# Patient Record
Sex: Male | Born: 1955 | Race: Black or African American | Hispanic: No | Marital: Married | Smoking: Former smoker
Health system: Southern US, Community
[De-identification: ages and names within clinical notes are randomized; demographics above are authoritative.]

## PROBLEM LIST (undated history)

## (undated) DIAGNOSIS — J939 Pneumothorax, unspecified: Secondary | ICD-10-CM

## (undated) DIAGNOSIS — I10 Essential (primary) hypertension: Secondary | ICD-10-CM

## (undated) DIAGNOSIS — R569 Unspecified convulsions: Secondary | ICD-10-CM

## (undated) HISTORY — PX: INNER EAR SURGERY: SHX679

## (undated) HISTORY — PX: CHEST TUBE INSERTION: SHX231

---

## 1999-07-10 ENCOUNTER — Emergency Department (HOSPITAL_COMMUNITY): Admission: EM | Admit: 1999-07-10 | Discharge: 1999-07-10 | Payer: Self-pay | Admitting: Emergency Medicine

## 2000-07-14 ENCOUNTER — Encounter: Payer: Self-pay | Admitting: Emergency Medicine

## 2000-07-14 ENCOUNTER — Emergency Department (HOSPITAL_COMMUNITY): Admission: EM | Admit: 2000-07-14 | Discharge: 2000-07-14 | Payer: Self-pay | Admitting: Emergency Medicine

## 2000-07-21 ENCOUNTER — Encounter: Payer: Self-pay | Admitting: *Deleted

## 2000-07-21 ENCOUNTER — Encounter: Admission: RE | Admit: 2000-07-21 | Discharge: 2000-07-21 | Payer: Self-pay | Admitting: *Deleted

## 2001-05-10 ENCOUNTER — Emergency Department (HOSPITAL_COMMUNITY): Admission: EM | Admit: 2001-05-10 | Discharge: 2001-05-10 | Payer: Self-pay | Admitting: Emergency Medicine

## 2003-02-07 ENCOUNTER — Ambulatory Visit (HOSPITAL_COMMUNITY): Admission: RE | Admit: 2003-02-07 | Discharge: 2003-02-07 | Payer: Self-pay | Admitting: *Deleted

## 2003-02-07 ENCOUNTER — Encounter: Payer: Self-pay | Admitting: *Deleted

## 2003-02-23 ENCOUNTER — Encounter: Admission: RE | Admit: 2003-02-23 | Discharge: 2003-02-23 | Payer: Self-pay | Admitting: Neurology

## 2003-02-23 ENCOUNTER — Encounter: Payer: Self-pay | Admitting: Neurology

## 2004-03-06 ENCOUNTER — Inpatient Hospital Stay (HOSPITAL_COMMUNITY): Admission: EM | Admit: 2004-03-06 | Discharge: 2004-03-12 | Payer: Self-pay | Admitting: Emergency Medicine

## 2004-03-18 ENCOUNTER — Encounter
Admission: RE | Admit: 2004-03-18 | Discharge: 2004-03-18 | Payer: Self-pay | Admitting: Thoracic Surgery (Cardiothoracic Vascular Surgery)

## 2004-04-05 ENCOUNTER — Encounter
Admission: RE | Admit: 2004-04-05 | Discharge: 2004-04-05 | Payer: Self-pay | Admitting: Thoracic Surgery (Cardiothoracic Vascular Surgery)

## 2006-03-16 ENCOUNTER — Encounter: Admission: RE | Admit: 2006-03-16 | Discharge: 2006-03-16 | Payer: Self-pay | Admitting: Family Medicine

## 2006-06-02 ENCOUNTER — Encounter: Admission: RE | Admit: 2006-06-02 | Discharge: 2006-06-02 | Payer: Self-pay | Admitting: Neurology

## 2006-08-19 IMAGING — CR DG CHEST 2V
2 series · 2 of 2 positions shown · non-contrast
Comparison: 04/05/04.

CLINICAL DATA: Left sided chest pain.
 CHEST X-RAY:

[w chest pa]
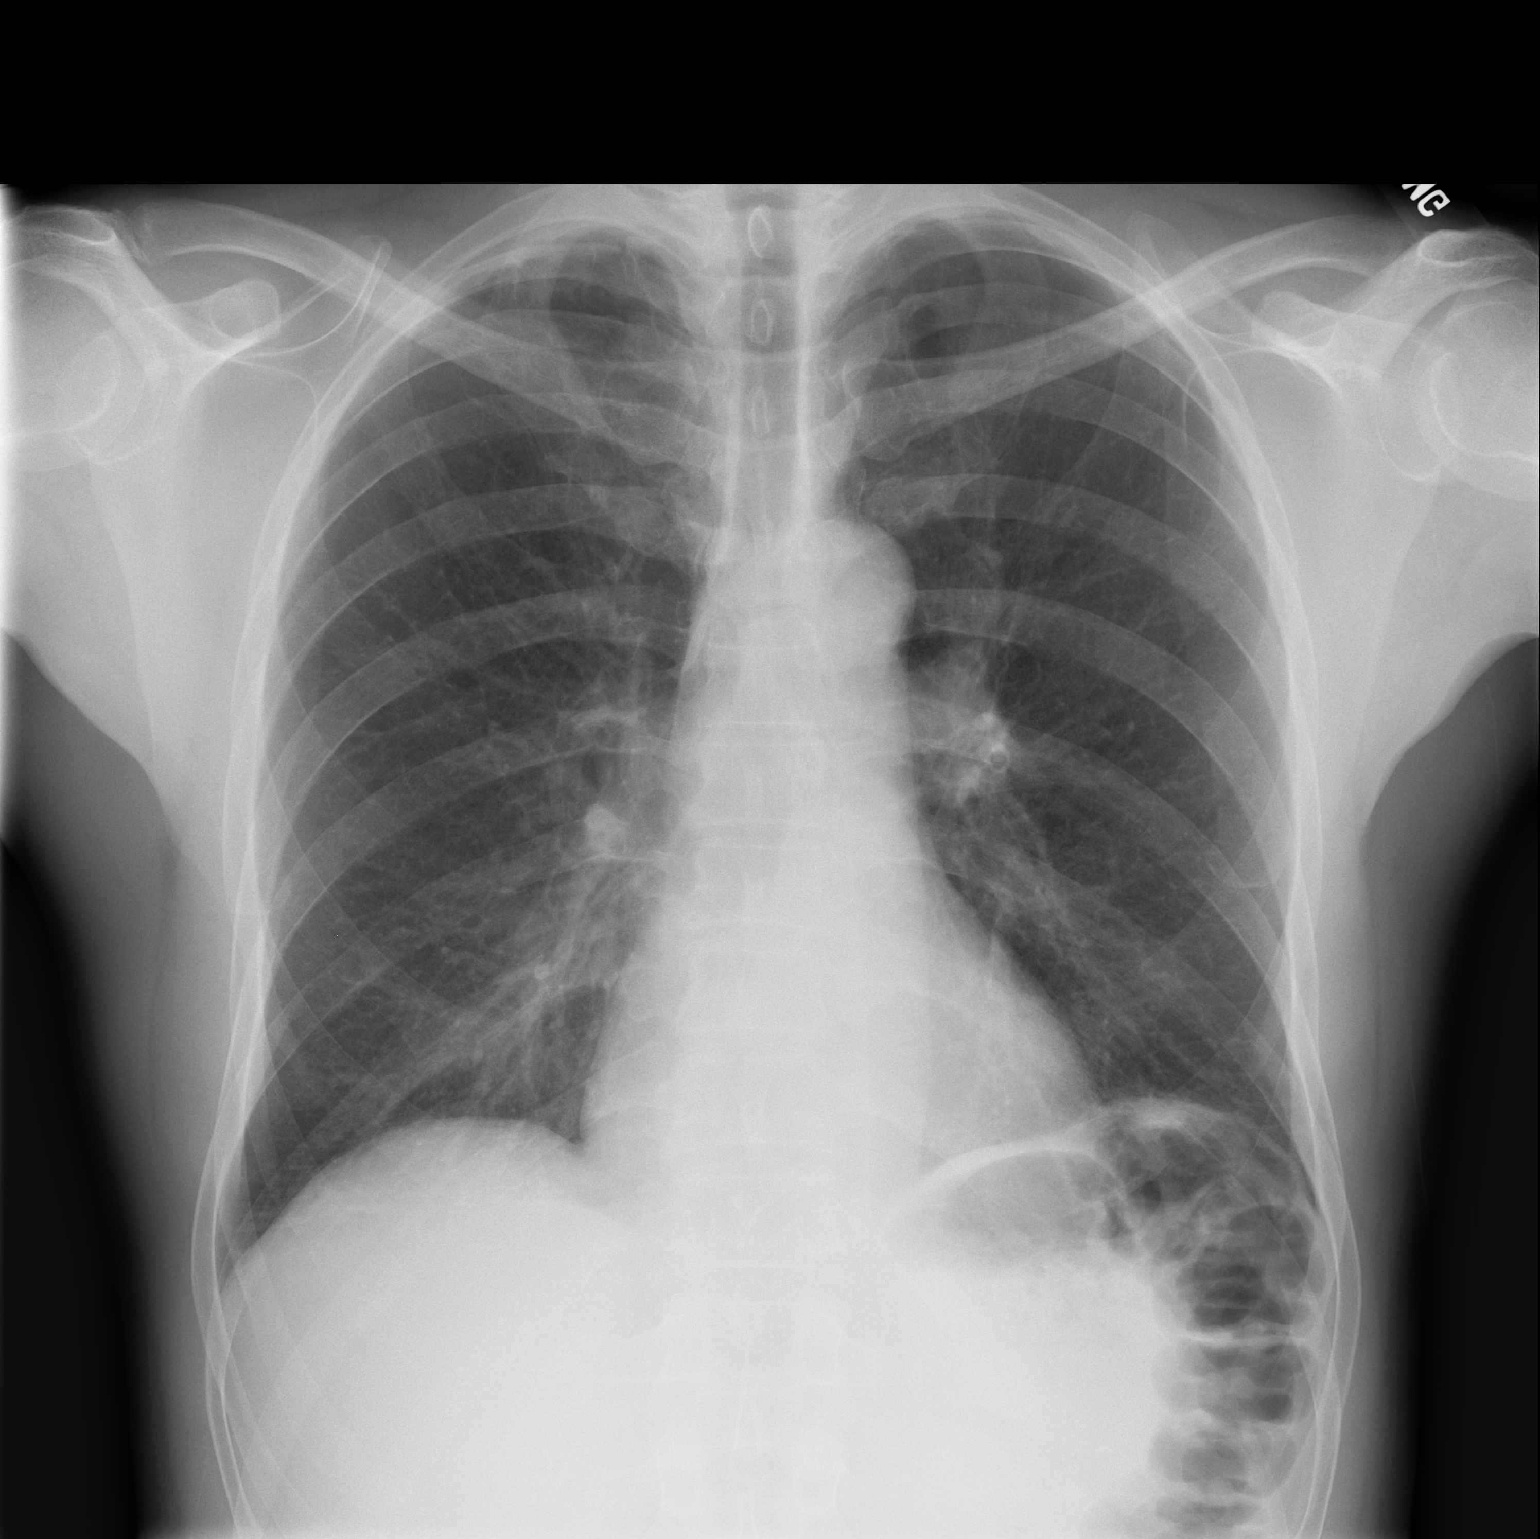

[w chest lat]
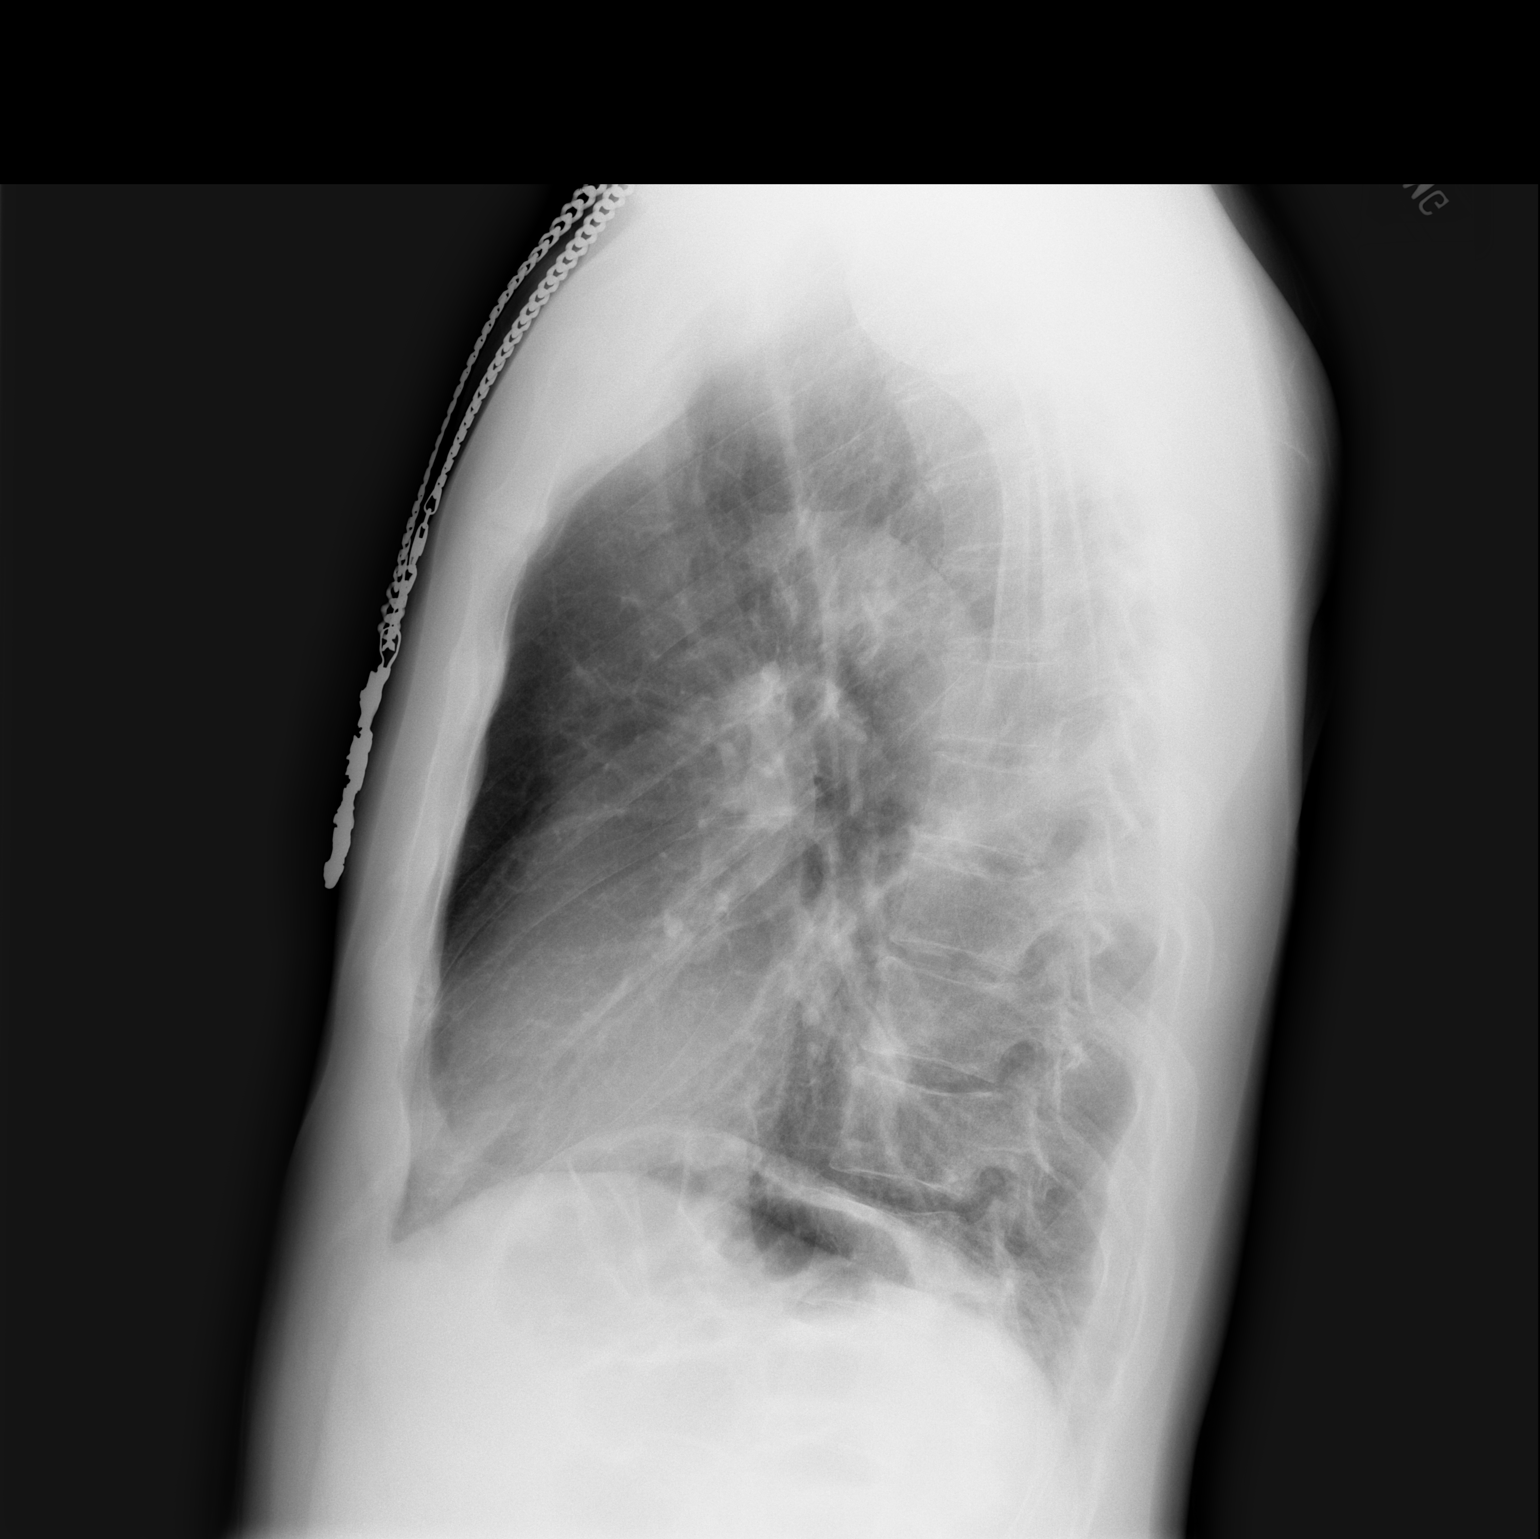

[2 of 2 positions shown; findings below may reference images not displayed]

Two views of the chest show no active infiltrate or effusion.  Minimal linear atelectasis is present at the right lung base.  The heart shows mild peribronchial thickening present. The heart is within normal limits in size.
IMPRESSION: Mild right basilar atelectasis.  Mild peribronchial thickening.

## 2006-11-05 IMAGING — CR DG SHOULDER 2+V*L*
3 series · 3 of 3 positions shown · non-contrast
Comparison: Report of [REDACTED] left shoulder radiograph, 07/21/00, (films purged).

CLINICAL DATA: Status post fall injury one week ago.  Decreased range of  motion.   Left shoulder pain.
 LEFT SHOULDER, THREE VIEWS:

[view not recorded (1 of 3)]
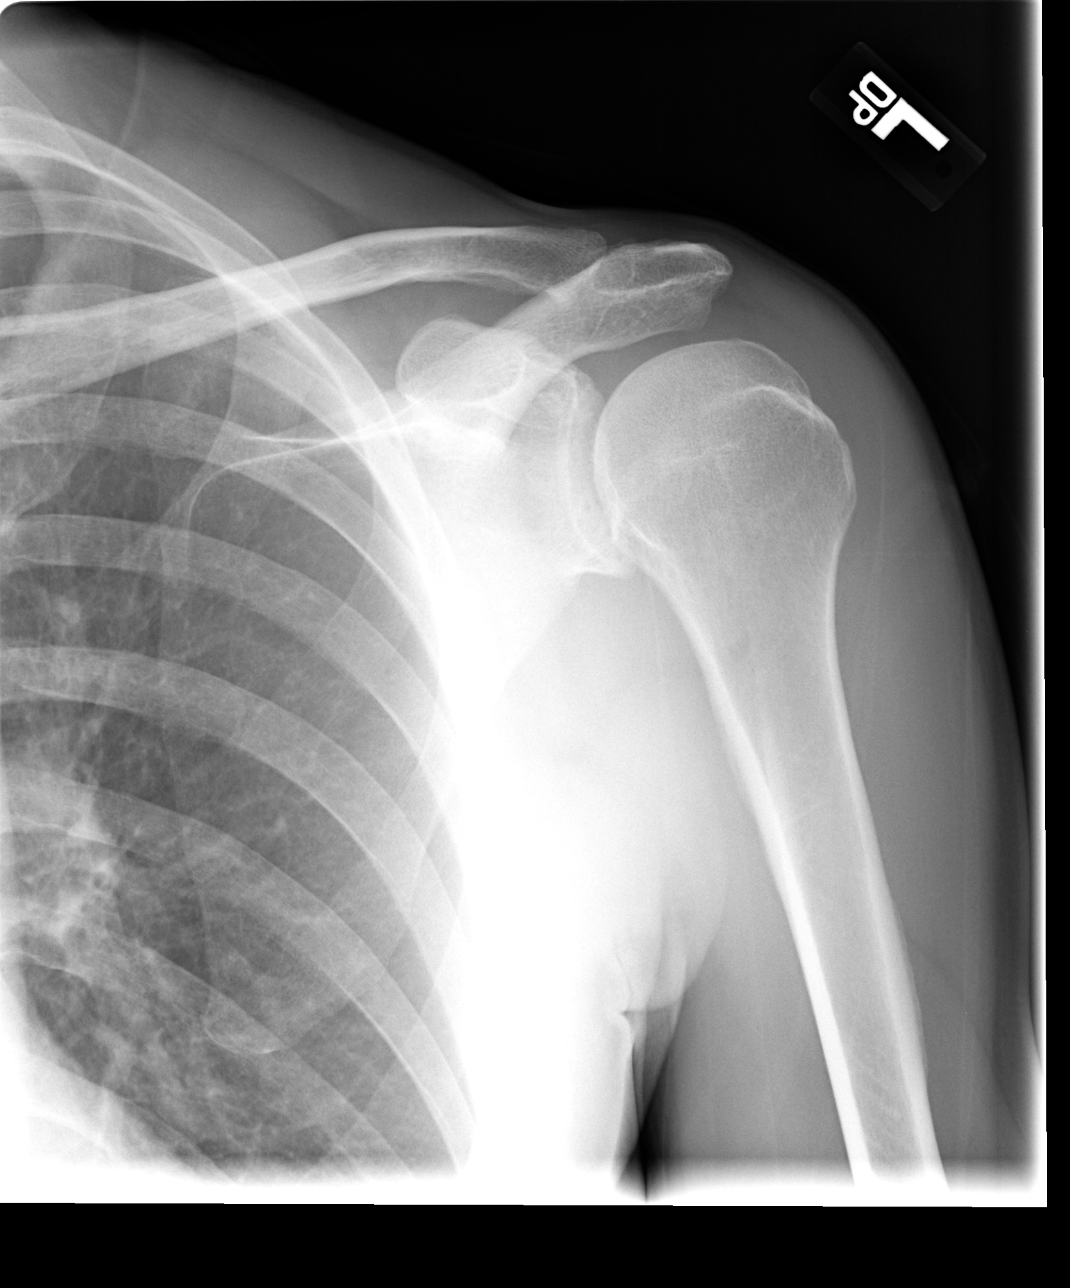

[view not recorded (2 of 3)]
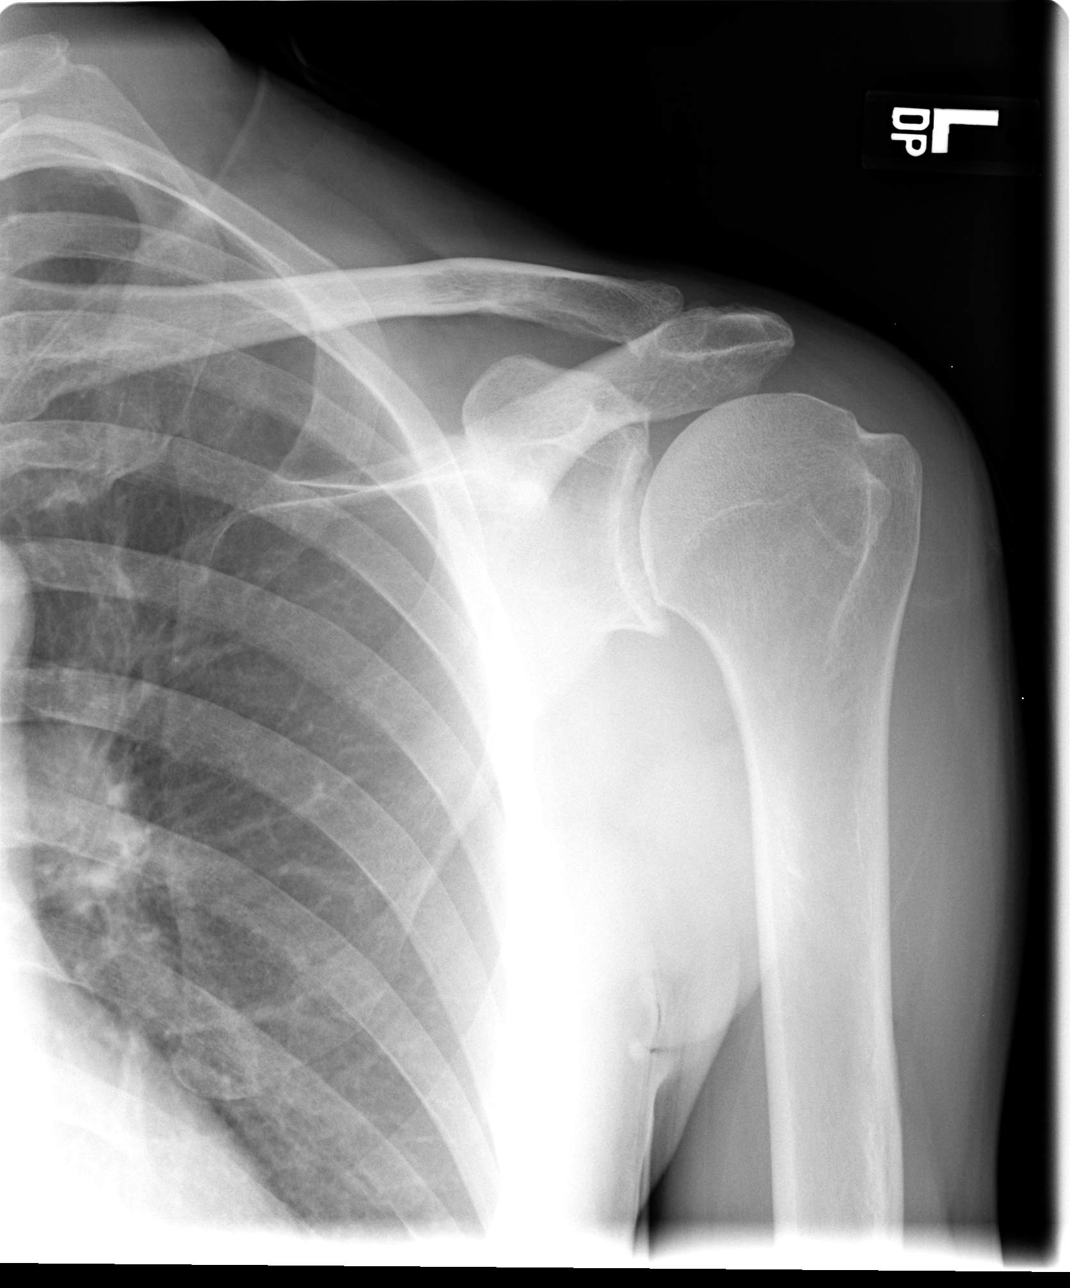

[view not recorded (3 of 3)]
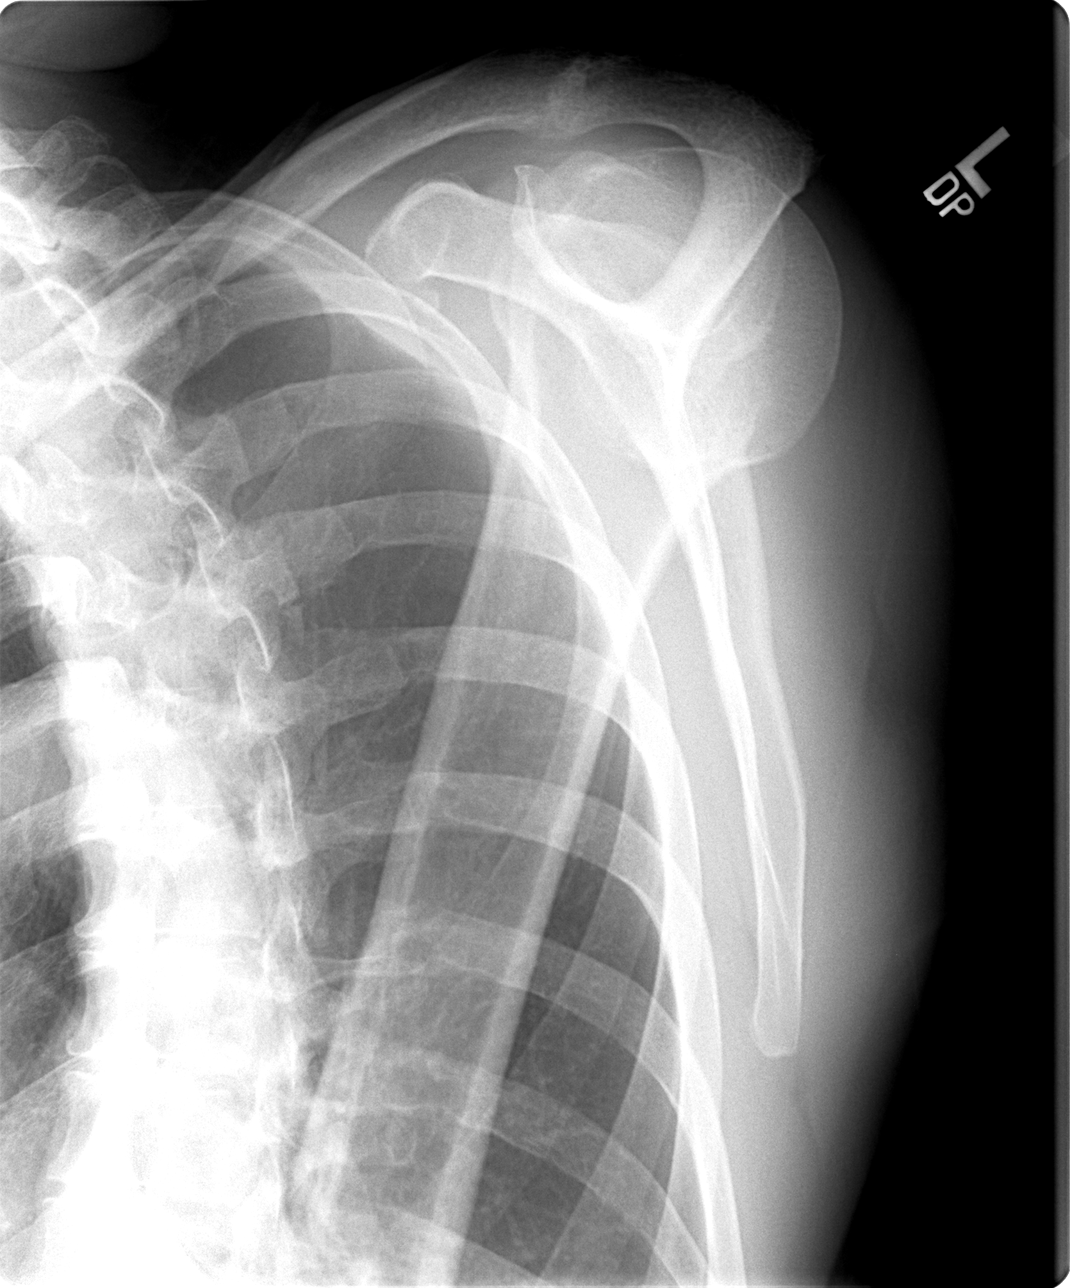

[3 of 3 positions shown; findings below may reference images not displayed]

No acute fracture or subluxation-dislocation is seen.  Slight degenerative changes are seen at the left acromioclavicular joint with small undersurface distal clavicular bone spur.  No other significant radiographic abnormality is seen.
IMPRESSION: 1.  Slight degenerative change, left AC joint.
 2.  No acute abnormality.

## 2007-05-09 ENCOUNTER — Emergency Department (HOSPITAL_COMMUNITY): Admission: EM | Admit: 2007-05-09 | Discharge: 2007-05-09 | Payer: Self-pay | Admitting: Emergency Medicine

## 2009-05-21 ENCOUNTER — Ambulatory Visit: Payer: Self-pay | Admitting: Occupational Medicine

## 2009-05-21 DIAGNOSIS — T169XXA Foreign body in ear, unspecified ear, initial encounter: Secondary | ICD-10-CM

## 2009-05-21 DIAGNOSIS — R569 Unspecified convulsions: Secondary | ICD-10-CM

## 2009-09-08 ENCOUNTER — Emergency Department (HOSPITAL_COMMUNITY): Admission: EM | Admit: 2009-09-08 | Discharge: 2009-09-08 | Payer: Self-pay | Admitting: Emergency Medicine

## 2011-04-04 LAB — CBC
HCT: 42.4 % (ref 39.0–52.0)
Hemoglobin: 14.6 g/dL (ref 13.0–17.0)
MCV: 95.8 fL (ref 78.0–100.0)
Platelets: 151 10*3/uL (ref 150–400)
RDW: 13.5 % (ref 11.5–15.5)

## 2011-04-04 LAB — ELECTROLYTE PANEL
CO2: 27 mEq/L (ref 19–32)
Chloride: 105 mEq/L (ref 96–112)
Sodium: 139 mEq/L (ref 135–145)

## 2011-04-04 LAB — DIFFERENTIAL
Basophils Relative: 0 % (ref 0–1)
Lymphocytes Relative: 22 % (ref 12–46)
Monocytes Absolute: 0.3 10*3/uL (ref 0.1–1.0)

## 2011-04-04 LAB — GLUCOSE, CAPILLARY: Glucose-Capillary: 127 mg/dL — ABNORMAL HIGH (ref 70–99)

## 2011-04-04 LAB — CARBAMAZEPINE LEVEL, TOTAL: Carbamazepine Lvl: <2 ug/mL — ABNORMAL LOW (ref 4.0–12.0)

## 2011-04-04 LAB — ETHANOL: Alcohol, Ethyl (B): <5 mg/dL (ref 0–10)

## 2011-04-04 LAB — RAPID URINE DRUG SCREEN, HOSP PERFORMED
Amphetamines: NOT DETECTED
Tetrahydrocannabinol: POSITIVE — AB

## 2011-04-04 LAB — VALPROIC ACID LEVEL: Valproic Acid Lvl: <10 ug/mL — ABNORMAL LOW (ref 50.0–100.0)

## 2011-05-16 NOTE — Discharge Summary (Signed)
[name], [name] NO.:  [id number]   MEDICAL RECORD NO.:  [id number]                   PATIENT TYPE:  INP   LOCATION:  5702                                 FACILITY:  [facility]   PHYSICIAN:  Salvatore Decent. Dorris Fetch, M.D.         DATE OF BIRTH:  [birth year]   DATE OF ADMISSION:  03/06/2004  DATE OF DISCHARGE:  03/12/2004                                 DISCHARGE SUMMARY   CONSULTING PHYSICIAN:  Viviann Spare C. Dorris Fetch, M.D.   PRIMARY ADMITTING DIAGNOSES:  1. Shortness of breath.  2. Chest pain.   ADDITIONAL/DISCHARGE DIAGNOSES:  1. Right spontaneous pneumothorax.  2. History of intracerebral arteriovenous malformation.  3. Seizure disorder.   PROCEDURE PERFORMED:  Placement of right sided chest tube.   HISTORY:  The patient is a 55 year old black male who presented to the ER on  the date of this admission, complaining of a several hour history of acute  onset right sided chest pain with associated shortness of breath.  He had no  history of cough, fevers, chills, or sweats.  The pain persisted and  actually continued to worsen, and he presented to the emergency department  for further evaluation.  A chest x-ray was obtained there which showed a 40-  50% right sided pneumothorax.  Because of this finding, a cardiothoracic  surgery consultation was obtained.  Dr. Dorris Fetch saw the patient in the  emergency department and placed a chest tube there.  He is admitted at this  time for management of chest tube.   HOSPITAL COURSE:  The patient was transferred to the 5700 unit following  placement of the chest tube.  A followup chest x-ray showed the tube to be  in good position with no residual pneumothorax.  The chest tube was placed  on __________ of 20-cm continuous suction.  His chest x-rays over the next  48-72 hours remained stable with no evidence of pneumothorax, but on exam he  had a persistent air leak.  For this reason, his chest tube was  continued to  suction.  He remained stable throughout his course.  By his fifth hospital  day, his air leak had resolved.  He is continued to suction but it was  decreased to negative 10-cm.  By March 11, 2004, the air leak was resolved  on low suction.  His chest tube was decreased to water seal.  At the present  time, he has had no further evidence of pneumothorax by chest x-ray.  He has  had no further air leak since the chest tube has been on water seal.  He has  done well otherwise during this admission.  He has been afebrile and all  vital signs have been stable.  He has maintained O2 sats of greater than 90%  on room air.  He has been ambulating in the halls without difficulty.  He is  tolerating a regular diet and is having normal bowel  and bladder function.  He has been counseled regarding smoking cessation.   At followup a PA and lateral chest x-ray will be performed on the morning of  March 12, 2004.  If he continues to have no evidence of pneumothorax and no  air leak on exam, his chest tube will be removed.  If a followup chest x-ray  remains clear and no residual pneumothorax exists, then he will be ready for  discharge home, hopefully in the afternoon of March 12, 2004.   DISCHARGE MEDICATIONS:  1. Depakote 1500 mg every day.  2. Nicoderm CQ 21 mg patch every day.  3. Tylox 1-2 q.4h. p.r.n. for pain.   DISCHARGE INSTRUCTIONS:  He is to refrain from driving, heavy lifting, or  strenuous activity.  He may continue daily walking and use of his incentive  spirometer.  He is asked to shower daily and clean his incisions with soap  and water.   DISCHARGE FOLLOWUP:  He will return to our office, on April 05, 2004, for a  recheck by Dr. Dorris Fetch.  He will have a chest x-ray at [facility] one hour prior to this appointment and is asked to bring  his films to our office for Dr. Sunday Corn review.  In the interim, if he  experiences any acute onset chest  pain, shortness of breath, drainage,  redness or swelling from the chest tube site, or fever greater than 101, he  is asked to call our office immediately.      Coral Ceo, P.A.                        Salvatore Decent Dorris Fetch, M.D.    GC/MEDQ  D:  03/11/2004  T:  03/12/2004  Job:  [id number]   cc:   [facility] Office

## 2011-05-16 NOTE — H&P (Signed)
[name], [name] NO.:  [id number]   MEDICAL RECORD NO.:  [id number]                   PATIENT TYPE:  INP   LOCATION:  5727                                 FACILITY:  [facility]   PHYSICIAN:  Salvatore Decent. Dorris Fetch, M.D.         DATE OF BIRTH:  May 11, 1956   DATE OF ADMISSION:  03/06/2004  DATE OF DISCHARGE:                                HISTORY & PHYSICAL   CHIEF COMPLAINT:  Acute onset chest pain and shortness of breath.   HISTORY OF PRESENT ILLNESS:  [name] is a 55 year old African American male  with a several hour history of acute onset of right-sided chest pain and  shortness of breath.  The patient states that he was going about his daily  activities and noticed a sharp pain in his right side that persisted and  worsened over time.  He stated he was extremely short of breath and felt it  was difficult to breath.  He had severe, pleuritic chest pain.  The patient  denies any history of pneumonia, cough, fever, chills or night sweats.  He  denies any significant trauma.  He admits to a one-pack per day smoking  history for approximately 20 years.  The patient continues to smoke at this  time.  With the persistence and worsening of the right sided chest pain, the  patient decided to present to the emergency department for further  evaluation.  Once seen in the emergency department, the patient underwent a  portable chest x-ray which showed a severe 40-50% large, right pneumothorax.  The emergency room physician decided it was in the patient's best interest  to consult cardiovascular and thoracic surgeons of [facility] for chest tube  placement to relieve spontaneous pneumothorax.  Dr. Orson Aloe of [facility]  responded to the consultation appropriately.   Dr.  Orson Aloe saw and examined the patient in the emergency department and  agreed that the patient had a large, spontaneous right pneumothorax.  The  plan was to insert a chest tube to relieve the  pneumothorax and to admit the  patient for further management and evaluation.   PERTINENT PAST MEDICAL HISTORY:  The patient has a history of an AV  malformation, intracerebral and a seizure disorder for which he is on  Depakote therapy.  The patient otherwise denies any history of diabetes  mellitus, heart disease, chronic obstructive pulmonary disease, history of  pneumonia or any history of pneumothorax in the past.   CURRENT MEDICATIONS:  Depakote 1500 mg p.o. daily.   ALLERGIES:  No known drug allergies.   SOCIAL HISTORY:  The patient is married and lives in [place], [place].  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  He has a tobacco use history of one pack per day for 20 years.  The  patient  denies any alcohol use.   FAMILY HISTORY:  The patient denies any  family history of pulmonary problems  or spontaneous pneumothorax.  He denies any family history of heart disease,  diabetes mellitus or bleeding sores.   REVIEW OF SYSTEMS:  Please see HPI for pertinent positives and negatives.  Otherwise, HEENT is noncontributory.  CARDIAC:  The patient denies any  palpitations, heart murmur, PND or orthopnea.  ABDOMEN:  The patient denies  any diarrhea, constipation, hematochezia, or ulcer disease.  EXTREMITIES:  The patient denies any peripheral edema, varicosities, numbness or tingling.  MUSCULOSKELETAL:  The patient denies any arthritis, joint pains or problems.   PHYSICAL EXAMINATION:  VITAL SIGNS:  At the time of presentation:  Temperature 97.8, blood pressure 167/94, pulse 54 and regular, respirations  28.  Saturations 96% on room air.  The patient weighs 160 pounds and is 75  inches tall.  HEENT:  Normocephalic, atraumatic.  Pupils equal, round, reactive to light  and accommodation.  EOMI.  Oral mucosa pink and moist.  CARDIAC:  Heart:  An irregular rate and rhythm without murmur, gallop or  rub.  Normal S1, S2.  LUNGS:  Trachea is  midline.  Breath sounds are significantly decreased on  the right with crackles.  Otherwise, the left is clear.  ABDOMEN:  Soft, nontender, nondistended with good bowel sounds, no  organomegaly or masses.  EXTREMITIES:  No peripheral edema. Good peripheral pulses, well perfused.  SKIN:  Warm and dry.  NEUROLOGIC:  Grossly intact without any focal deficits.   IMPRESSION:  Significant spontaneous right pneumothorax of unknown etiology.  The patient does have a significant history of tobacco abuse and history of  working in a Photographer.   PLAN:  We will admit the patient to [facility] under the [facility]  Service for insertion of a chest tube to relieve spontaneous pneumothorax as  well as pain management and further evaluation and treatment.  We will  initiate a smoking cessation consultation on admission.  The patient is  being admitted under Dr. Dorris Fetch of [facility], and the estimated length of  hospital stay is approximately four to five days.      Carolyn A. Eustaquio Boyden.                  Salvatore Decent Dorris Fetch, M.D.    CAF/MEDQ  D:  03/08/2004  T:  03/09/2004  Job:  [id number]   cc:   Salvatore Decent. Dorris Fetch, M.D.  [address]  [place]  [place] [address]

## 2012-11-30 ENCOUNTER — Emergency Department (HOSPITAL_BASED_OUTPATIENT_CLINIC_OR_DEPARTMENT_OTHER)
Admission: EM | Admit: 2012-11-30 | Discharge: 2012-11-30 | Disposition: A | Discharge status: Home / Self Care | Payer: Medicare Other | Attending: Emergency Medicine | Admitting: Emergency Medicine

## 2012-11-30 ENCOUNTER — Encounter (HOSPITAL_BASED_OUTPATIENT_CLINIC_OR_DEPARTMENT_OTHER): Payer: Self-pay

## 2012-11-30 DIAGNOSIS — K0889 Other specified disorders of teeth and supporting structures: Secondary | ICD-10-CM

## 2012-11-30 DIAGNOSIS — I10 Essential (primary) hypertension: Secondary | ICD-10-CM | POA: Insufficient documentation

## 2012-11-30 DIAGNOSIS — G40909 Epilepsy, unspecified, not intractable, without status epilepticus: Secondary | ICD-10-CM | POA: Insufficient documentation

## 2012-11-30 DIAGNOSIS — Z87891 Personal history of nicotine dependence: Secondary | ICD-10-CM | POA: Insufficient documentation

## 2012-11-30 DIAGNOSIS — K047 Periapical abscess without sinus: Secondary | ICD-10-CM | POA: Insufficient documentation

## 2012-11-30 DIAGNOSIS — Z8709 Personal history of other diseases of the respiratory system: Secondary | ICD-10-CM | POA: Insufficient documentation

## 2012-11-30 DIAGNOSIS — Z79899 Other long term (current) drug therapy: Secondary | ICD-10-CM | POA: Insufficient documentation

## 2012-11-30 HISTORY — DX: Essential (primary) hypertension: I10

## 2012-11-30 HISTORY — DX: Pneumothorax, unspecified: J93.9

## 2012-11-30 HISTORY — DX: Unspecified convulsions: R56.9

## 2012-11-30 MED ORDER — HYDROCODONE-ACETAMINOPHEN 5-500 MG PO TABS: 1.0000 | ORAL_TABLET | Freq: Four times a day (QID) | ORAL | Status: DC | PRN

## 2012-11-30 MED ORDER — AMOXICILLIN 500 MG PO CAPS: 500.0000 mg | ORAL_CAPSULE | Freq: Three times a day (TID) | ORAL | Status: DC

## 2012-11-30 NOTE — ED Provider Notes (Signed)
History     CSN: [id number]  Arrival date & time 11/30/12  4098   First MD Initiated Contact with Patient 11/30/12 [phone]      Chief Complaint  Patient presents with  . Dental Pain    (Consider location/radiation/quality/duration/timing/severity/associated sxs/prior treatment) Patient is a 56 y.o. male presenting with tooth pain. The history is provided by the patient.  Dental PainPrimary symptoms do not include headaches, fever or shortness of breath.  Additional symptoms do not include: trouble swallowing.  pt c/o right lower dental pain, swelling, for past week. Constant. Dull, moderate-severe. No acute or abrupt change today. States saw a dentist last week and was told needed to see an oral Careers adviser. No fever or chills. No sore throat. No trouble breathing or swallowing.      Past Medical History  Diagnosis Date  . Hypertension   . Seizures   . Pneumothorax     Past Surgical History  Procedure Date  . Inner ear surgery   . Chest tube insertion     No family history on file.  History  Substance Use Topics  . Smoking status: Former Games developer  . Smokeless tobacco: Not on file  . Alcohol Use: Yes     Comment: occasional      Review of Systems  Constitutional: Negative for fever and chills.  HENT: Negative for trouble swallowing.   Respiratory: Negative for shortness of breath.   Neurological: Negative for headaches.    Allergies  Review of patient's allergies indicates no known allergies.  Home Medications   Current Outpatient Rx  Name  Route  Sig  Dispense  Refill  . LEVETIRACETAM 250 MG PO TABS   Oral   Take 250 mg by mouth every 12 (twelve) hours.           BP 135/80  Pulse 65  Temp 97.7 F (36.5 C) (Oral)  Resp 14  Ht 6\' 3"  (1.905 m)  Wt 165 lb (74.844 kg)  BMI 20.62 kg/m2  SpO2 100%  Physical Exam  Nursing note and vitals reviewed. Constitutional: He is oriented to person, place, and time. He appears well-developed and well-nourished.  No distress.  HENT:  Head: Atraumatic.  Mouth/Throat: Oropharynx is clear and moist.       Right lower dental decay/broken off tooth, associated gum swelling and tenderness. No trismus. No swelling/pain/tenderness to floor of mouth or neck.    Eyes: Conjunctivae normal are normal.  Neck: Neck supple. No tracheal deviation present.  Cardiovascular: Normal rate.   Pulmonary/Chest: Effort normal. No accessory muscle usage. No respiratory distress.  Musculoskeletal: Normal range of motion.  Lymphadenopathy:    He has no cervical adenopathy.  Neurological: He is alert and oriented to person, place, and time.  Skin: Skin is warm and dry. No rash noted.  Psychiatric: He has a normal mood and affect.    ED Course  Procedures (including critical care time)     MDM  Confirmed nkda w pt.   Pt requests referral to oral surgeon.   rx vicodin, amox, discussed need dental/oral surgery f/u.         Suzi Roots, MD 11/30/12 [phone]

## 2012-11-30 NOTE — ED Notes (Signed)
Pt reports dental pain x 1 week.  He was seen in ED and at [facility], given PO antibiotics, pain medication and referred to an Transport planner.

## 2012-11-30 NOTE — Discharge Instructions (Signed)
Take antibiotic (amoxicillin) as prescribed. Take motrin or aleve as need for pain. You may also take vicodin as need for pain. No driving when taking vicodin. Also, do not take tylenol or acetaminophen containing medication when taking vicodin. Follow up with dentist/oral surgeon in the next few days  - call office today to arrange follow up appointment.  Return to ER if worse, facial/neck swelling, high fevers, intractable pain, trouble breathing or swallowing, other concern.       Abscessed Tooth A tooth abscess is a collection of infected fluid (pus) from a bacterial infection in the inner part of the tooth (pulp). It usually occurs at the end of the tooth's root.  CAUSES   A very bad cavity (extensive tooth decay).   Trauma to the tooth, such as a broken or chipped tooth, that allows bacteria to enter into the pulp.  SYMPTOMS  Severe pain in and around the infected tooth.   Swelling and redness around the abscessed tooth or in the mouth or face.   Tenderness.   Pus drainage.   Bad breath.   Bitter taste in the mouth.   Difficulty swallowing.   Difficulty opening the mouth.   Feeling sick to your stomach (nauseous).   Vomiting.   Chills.   Swollen neck glands.  DIAGNOSIS  A medical and dental history will be taken.   An examination will be performed by tapping on the abscessed tooth.   X-rays may be taken of the tooth to identify the abscess.  TREATMENT The goal of treatment is to eliminate the infection.   You may be prescribed antibiotic medicine to stop the infection from spreading.   A root canal may be performed to save the tooth. If the tooth cannot be saved, it may be pulled (extracted) and the abscess may be drained.  HOME CARE INSTRUCTIONS  Only take over-the-counter or prescription medicines for pain, fever, or discomfort as directed by your caregiver.   Do not drive after taking pain medicine (narcotics).   Rinse your mouth (gargle) often with  salt water ( tsp salt in 8 oz of warm water) to relieve pain or swelling.   Do not apply heat to the outside of your face.   Return to your dentist for further treatment as directed.  SEEK IMMEDIATE DENTAL CARE IF:  You have a temperature by mouth above 102 F (38.9 C), not controlled by medicine.   You have chills or a very bad headache.   You have problems breathing or swallowing.   Your have trouble opening your mouth.   You develop swelling in the neck or around the eye.   Your pain is not helped by medicine.   Your pain is getting worse instead of better.  Document Released: 12/15/2005 Document Revised: 12/04/2011 Document Reviewed: 03/25/2011 [facility] Patient Information 2012 [place], [place].     Dental Pain A tooth ache may be caused by cavities (tooth decay). Cavities expose the nerve of the tooth to air and hot or cold temperatures. It may come from an infection or abscess (also called a boil or furuncle) around your tooth. It is also often caused by dental caries (tooth decay). This causes the pain you are having. DIAGNOSIS  Your caregiver can diagnose this problem by exam. TREATMENT   If caused by an infection, it may be treated with medications which kill germs (antibiotics) and pain medications as prescribed by your caregiver. Take medications as directed.  Only take over-the-counter or prescription medicines for pain,  discomfort, or fever as directed by your caregiver.  Whether the tooth ache today is caused by infection or dental disease, you should see your dentist as soon as possible for further care. SEEK MEDICAL CARE IF: The exam and treatment you received today has been provided on an emergency basis only. This is not a substitute for complete medical or dental care. If your problem worsens or new problems (symptoms) appear, and you are unable to meet with your dentist, call or return to this location. SEEK IMMEDIATE MEDICAL CARE IF:   You have a  fever.  You develop redness and swelling of your face, jaw, or neck.  You are unable to open your mouth.  You have severe pain uncontrolled by pain medicine. MAKE SURE YOU:   Understand these instructions.  Will watch your condition.  Will get help right away if you are not doing well or get worse. Document Released: 12/15/2005 Document Revised: 03/08/2012 Document Reviewed: 08/02/2008 [facility] Patient Information 2013 [place], [place].

## 2014-07-03 ENCOUNTER — Emergency Department: Payer: Self-pay | Admitting: Emergency Medicine

## 2014-07-03 LAB — COMPREHENSIVE METABOLIC PANEL
ALBUMIN: 3.8 g/dL (ref 3.4–5.0)
ALK PHOS: 74 U/L
Anion Gap: 5 — ABNORMAL LOW (ref 7–16)
BUN: 15 mg/dL (ref 7–18)
Bilirubin,Total: 0.2 mg/dL (ref 0.2–1.0)
CHLORIDE: 104 mmol/L (ref 98–107)
CREATININE: 1.12 mg/dL (ref 0.60–1.30)
Calcium, Total: 8.6 mg/dL (ref 8.5–10.1)
Co2: 28 mmol/L (ref 21–32)
EGFR (African American): >60
Glucose: 98 mg/dL (ref 65–99)
Osmolality: 275 (ref 275–301)
POTASSIUM: 4.3 mmol/L (ref 3.5–5.1)
SGOT(AST): 23 U/L (ref 15–37)
SGPT (ALT): 42 U/L (ref 12–78)
Sodium: 137 mmol/L (ref 136–145)
Total Protein: 7.4 g/dL (ref 6.4–8.2)

## 2014-07-03 LAB — DRUG SCREEN, URINE
Amphetamines, Ur Screen: NEGATIVE (ref ?–1000)
Barbiturates, Ur Screen: NEGATIVE (ref ?–200)
Benzodiazepine, Ur Scrn: NEGATIVE (ref ?–200)
CANNABINOID 50 NG, UR ~~LOC~~: POSITIVE (ref ?–50)
Cocaine Metabolite,Ur ~~LOC~~: NEGATIVE (ref ?–300)
MDMA (Ecstasy)Ur Screen: NEGATIVE (ref ?–500)
Methadone, Ur Screen: POSITIVE (ref ?–300)
Opiate, Ur Screen: NEGATIVE (ref ?–300)
Phencyclidine (PCP) Ur S: NEGATIVE (ref ?–25)
TRICYCLIC, UR SCREEN: NEGATIVE (ref ?–1000)

## 2014-07-03 LAB — ETHANOL
Ethanol %: <0.003 % (ref 0.000–0.080)
Ethanol: <3 mg/dL

## 2014-07-03 LAB — CBC
HCT: 41.4 % (ref 40.0–52.0)
HGB: 13.6 g/dL (ref 13.0–18.0)
MCH: 30.4 pg (ref 26.0–34.0)
MCHC: 32.9 g/dL (ref 32.0–36.0)
MCV: 92 fL (ref 80–100)
Platelet: 283 10*3/uL (ref 150–440)
RBC: 4.48 10*6/uL (ref 4.40–5.90)
RDW: 13.9 % (ref 11.5–14.5)
WBC: 4.4 10*3/uL (ref 3.8–10.6)

## 2014-07-03 LAB — URINALYSIS, COMPLETE
BACTERIA: NONE SEEN
BLOOD: NEGATIVE
Bilirubin,UR: NEGATIVE
Glucose,UR: NEGATIVE mg/dL (ref 0–75)
KETONE: NEGATIVE
Leukocyte Esterase: NEGATIVE
NITRITE: NEGATIVE
Ph: 6 (ref 4.5–8.0)
Protein: NEGATIVE
RBC,UR: 1 /HPF (ref 0–5)
Specific Gravity: 1.02 (ref 1.003–1.030)
Squamous Epithelial: NONE SEEN

## 2014-07-03 LAB — ACETAMINOPHEN LEVEL: Acetaminophen: <2 ug/mL

## 2014-07-03 LAB — SALICYLATE LEVEL: Salicylates, Serum: <1.7 mg/dL

## 2014-07-04 ENCOUNTER — Emergency Department (HOSPITAL_COMMUNITY)
Admission: EM | Admit: 2014-07-04 | Discharge: 2014-07-05 | Disposition: A | Discharge status: Home / Self Care | Payer: Medicare Other | Attending: Emergency Medicine | Admitting: Emergency Medicine

## 2014-07-04 ENCOUNTER — Encounter (HOSPITAL_COMMUNITY): Payer: Self-pay | Admitting: Emergency Medicine

## 2014-07-04 DIAGNOSIS — G40909 Epilepsy, unspecified, not intractable, without status epilepticus: Secondary | ICD-10-CM | POA: Insufficient documentation

## 2014-07-04 DIAGNOSIS — Z8709 Personal history of other diseases of the respiratory system: Secondary | ICD-10-CM | POA: Insufficient documentation

## 2014-07-04 DIAGNOSIS — F1220 Cannabis dependence, uncomplicated: Secondary | ICD-10-CM | POA: Insufficient documentation

## 2014-07-04 DIAGNOSIS — F1320 Sedative, hypnotic or anxiolytic dependence, uncomplicated: Secondary | ICD-10-CM | POA: Insufficient documentation

## 2014-07-04 DIAGNOSIS — F1110 Opioid abuse, uncomplicated: Secondary | ICD-10-CM

## 2014-07-04 DIAGNOSIS — B356 Tinea cruris: Secondary | ICD-10-CM

## 2014-07-04 DIAGNOSIS — F1920 Other psychoactive substance dependence, uncomplicated: Secondary | ICD-10-CM

## 2014-07-04 DIAGNOSIS — I10 Essential (primary) hypertension: Secondary | ICD-10-CM | POA: Insufficient documentation

## 2014-07-04 DIAGNOSIS — Z79899 Other long term (current) drug therapy: Secondary | ICD-10-CM | POA: Insufficient documentation

## 2014-07-04 DIAGNOSIS — Z87891 Personal history of nicotine dependence: Secondary | ICD-10-CM | POA: Insufficient documentation

## 2014-07-04 DIAGNOSIS — F1120 Opioid dependence, uncomplicated: Secondary | ICD-10-CM | POA: Insufficient documentation

## 2014-07-04 LAB — RAPID URINE DRUG SCREEN, HOSP PERFORMED
AMPHETAMINES: NOT DETECTED
BARBITURATES: NOT DETECTED
BENZODIAZEPINES: POSITIVE — AB
Cocaine: NOT DETECTED
Opiates: POSITIVE — AB
TETRAHYDROCANNABINOL: POSITIVE — AB

## 2014-07-04 LAB — CBC WITH DIFFERENTIAL/PLATELET
BASOS ABS: 0 10*3/uL (ref 0.0–0.1)
Basophils Relative: 0 % (ref 0–1)
EOS PCT: 2 % (ref 0–5)
Eosinophils Absolute: 0.1 10*3/uL (ref 0.0–0.7)
HCT: 42 % (ref 39.0–52.0)
Hemoglobin: 14.4 g/dL (ref 13.0–17.0)
LYMPHS ABS: 2 10*3/uL (ref 0.7–4.0)
LYMPHS PCT: 39 % (ref 12–46)
MCH: 30.5 pg (ref 26.0–34.0)
MCHC: 34.3 g/dL (ref 30.0–36.0)
MCV: 89 fL (ref 78.0–100.0)
Monocytes Absolute: 0.4 10*3/uL (ref 0.1–1.0)
Monocytes Relative: 8 % (ref 3–12)
NEUTROS ABS: 2.6 10*3/uL (ref 1.7–7.7)
Neutrophils Relative %: 51 % (ref 43–77)
PLATELETS: 261 10*3/uL (ref 150–400)
RBC: 4.72 MIL/uL (ref 4.22–5.81)
RDW: 12.8 % (ref 11.5–15.5)
WBC: 5.1 10*3/uL (ref 4.0–10.5)

## 2014-07-04 LAB — COMPREHENSIVE METABOLIC PANEL
ALK PHOS: 78 U/L (ref 39–117)
ALT: 30 U/L (ref 0–53)
AST: 15 U/L (ref 0–37)
Albumin: 4 g/dL (ref 3.5–5.2)
Anion gap: 11 (ref 5–15)
BUN: 20 mg/dL (ref 6–23)
CHLORIDE: 100 meq/L (ref 96–112)
CO2: 30 meq/L (ref 19–32)
Calcium: 9.6 mg/dL (ref 8.4–10.5)
Creatinine, Ser: 1.01 mg/dL (ref 0.50–1.35)
GFR, EST NON AFRICAN AMERICAN: 79 mL/min — AB (ref >90)
GLUCOSE: 126 mg/dL — AB (ref 70–99)
POTASSIUM: 4.2 meq/L (ref 3.7–5.3)
SODIUM: 141 meq/L (ref 137–147)
Total Protein: 7.5 g/dL (ref 6.0–8.3)

## 2014-07-04 LAB — URINALYSIS, ROUTINE W REFLEX MICROSCOPIC
BILIRUBIN URINE: NEGATIVE
Glucose, UA: NEGATIVE mg/dL
Hgb urine dipstick: NEGATIVE
KETONES UR: NEGATIVE mg/dL
Leukocytes, UA: NEGATIVE
NITRITE: NEGATIVE
PH: 5 (ref 5.0–8.0)
PROTEIN: NEGATIVE mg/dL
Specific Gravity, Urine: 1.023 (ref 1.005–1.030)
Urobilinogen, UA: 0.2 mg/dL (ref 0.0–1.0)

## 2014-07-04 LAB — SALICYLATE LEVEL

## 2014-07-04 LAB — ETHANOL: Alcohol, Ethyl (B): <11 mg/dL (ref 0–11)

## 2014-07-04 LAB — ACETAMINOPHEN LEVEL

## 2014-07-04 MED ORDER — METHOCARBAMOL 500 MG PO TABS: 500.0000 mg | ORAL_TABLET | Freq: Three times a day (TID) | ORAL | Status: DC | PRN

## 2014-07-04 MED ORDER — NAPROXEN 500 MG PO TABS: 500.0000 mg | ORAL_TABLET | Freq: Two times a day (BID) | ORAL | Status: DC | PRN

## 2014-07-04 MED ORDER — LORAZEPAM 1 MG PO TABS: 1.0000 mg | ORAL_TABLET | Freq: Three times a day (TID) | ORAL | Status: DC | PRN

## 2014-07-04 MED ORDER — ONDANSETRON 4 MG PO TBDP: 4.0000 mg | ORAL_TABLET | Freq: Four times a day (QID) | ORAL | Status: DC | PRN

## 2014-07-04 MED ORDER — ACETAMINOPHEN 325 MG PO TABS: 650.0000 mg | ORAL_TABLET | ORAL | Status: DC | PRN

## 2014-07-04 MED ORDER — LOPERAMIDE HCL 2 MG PO CAPS: 2.0000 mg | ORAL_CAPSULE | ORAL | Status: DC | PRN

## 2014-07-04 MED ORDER — SODIUM CHLORIDE 0.9 % IV BOLUS (SEPSIS)
1000.0000 mL | Freq: Once | INTRAVENOUS | Status: AC
  Administered 2014-07-04: 1000 mL via INTRAVENOUS

## 2014-07-04 MED ORDER — NALOXONE HCL 0.4 MG/ML IJ SOLN
0.4000 mg | Freq: Once | INTRAMUSCULAR | Status: AC
  Administered 2014-07-04: 0.4 mg via INTRAVENOUS
  Filled 2014-07-04: qty 1

## 2014-07-04 MED ORDER — HYDROXYZINE HCL 25 MG PO TABS: 25.0000 mg | ORAL_TABLET | Freq: Four times a day (QID) | ORAL | Status: DC | PRN

## 2014-07-04 MED ORDER — IBUPROFEN 200 MG PO TABS
600.0000 mg | ORAL_TABLET | Freq: Three times a day (TID) | ORAL | Status: DC | PRN
  Administered 2014-07-05 (×2): 600 mg via ORAL
  Filled 2014-07-04 (×2): qty 3

## 2014-07-04 MED ORDER — DICYCLOMINE HCL 20 MG PO TABS: 20.0000 mg | ORAL_TABLET | Freq: Four times a day (QID) | ORAL | Status: DC | PRN

## 2014-07-04 NOTE — Progress Notes (Signed)
  CARE MANAGEMENT ED NOTE 07/04/2014  Patient:  [name],[name]   Account Number:  [id number]  Date Initiated:  07/04/2014  Documentation initiated by:  Radford PaxFERRERO,Chibuikem Thang  Subjective/Objective Assessment:   Patient reports to Ed with altered mental status     Subjective/Objective Assessment Detail:     Action/Plan:   Action/Plan Detail:   Anticipated DC Date:       Status Recommendation to Physician:   Result of Recommendation:    Other ED Services  Consult Working Plan    DC Planning Services  Other  PCP issues    Choice offered to / List presented to:            Status of service:  Completed, signed off  ED Comments:   ED Comments Detail:  EDCM spoke to patient and his family at bedside.  As per patient's family, [facility]  System updated.

## 2014-07-04 NOTE — ED Provider Notes (Signed)
CSN: [id number]     Arrival date & time 07/04/14  1939 History   First MD Initiated Contact with Patient 07/04/14 1945     Chief Complaint  Patient presents with  . Altered Mental Status     (Consider location/radiation/quality/duration/timing/severity/associated sxs/prior Treatment) The history is provided by the patient.  [name] is a 58 y.o. male hx of HTN, seizures, heroin use here with AMS. [facility] for [facility]. He was seen there and was give outpatient referral. He went home today. He went and got some heroin from someone. He came home and family noted that he was more confused. Patient doesn't know what he took. Denies alcohol intake. Denies recent seizures.   Level V caveat- AMS    Past Medical History  Diagnosis Date  . Hypertension   . Seizures   . Pneumothorax    Past Surgical History  Procedure Laterality Date  . Inner ear surgery    . Chest tube insertion     History reviewed. No pertinent family history. History  Substance Use Topics  . Smoking status: Former Games developermoker  . Smokeless tobacco: Not on file  . Alcohol Use: Yes     Comment: occasional    Review of Systems  Unable to perform ROS: Mental status change      Allergies  Review of patient's allergies indicates no known allergies.  Home Medications   Prior to Admission medications   Medication Sig Start Date End Date Taking? Authorizing Provider  cloNIDine (CATAPRES) 0.1 MG tablet Take 0.1 mg by mouth 3 (three) times daily. 06/30/14  Yes Historical Provider, MD  hydrOXYzine (ATARAX/VISTARIL) 50 MG tablet Take 50 mg by mouth 4 (four) times daily. 06/30/14  Yes Historical Provider, MD  levETIRAcetam (KEPPRA) 750 MG tablet Take 1,500 mg by mouth every 8 (eight) hours.   Yes Historical Provider, MD  methadone (DOLOPHINE) 10 MG tablet Take 20 mg by mouth daily. 07/01/14  Yes Historical Provider, MD  topiramate (TOPAMAX) 100 MG tablet Take 100 mg by mouth every 12  (twelve) hours. 06/30/14  Yes Historical Provider, MD  traZODone (DESYREL) 100 MG tablet Take 100 mg by mouth at bedtime. 06/30/14  Yes Historical Provider, MD   BP 165/79  Pulse 78  Temp(Src) 98.6 F (37 C) (Oral)  Resp 16  SpO2 96% Physical Exam  Nursing note and vitals reviewed. Constitutional:  Sleepy but arousable   HENT:  Head: Normocephalic.  Mouth/Throat: Oropharynx is clear and moist.  Eyes: Conjunctivae and EOM are normal. Pupils are equal, round, and reactive to light.  Neck: Normal range of motion. Neck supple.  Cardiovascular: Normal rate, regular rhythm and normal heart sounds.   Pulmonary/Chest: Effort normal and breath sounds normal. No respiratory distress. He has no wheezes. He has no rales.  Abdominal: Soft. Bowel sounds are normal. He exhibits no distension. There is no tenderness. There is no rebound and no guarding.  Musculoskeletal: Normal range of motion. He exhibits no edema and no tenderness.  Neurological:  Tired but arousable. Moving all extremities.   Skin: Skin is warm and dry.  Psychiatric:  Poor judgment.     ED Course  Procedures (including critical care time) Labs Review Labs Reviewed  COMPREHENSIVE METABOLIC PANEL - Abnormal; Notable for the following:    Glucose, Bld 126 (*)    Total Bilirubin <0.2 (*)    GFR calc non Af Amer 79 (*)    All other components within normal limits  SALICYLATE  LEVEL - Abnormal; Notable for the following:    Salicylate Lvl <2.0 (*)    All other components within normal limits  URINE RAPID DRUG SCREEN (HOSP PERFORMED) - Abnormal; Notable for the following:    Opiates POSITIVE (*)    Benzodiazepines POSITIVE (*)    Tetrahydrocannabinol POSITIVE (*)    All other components within normal limits  CBC WITH DIFFERENTIAL  ETHANOL  ACETAMINOPHEN LEVEL  URINALYSIS, ROUTINE W REFLEX MICROSCOPIC    Imaging Review No results found.   EKG Interpretation None      MDM   Final diagnoses:  None    Dale SeatsCharles E  [name] is a 58 y.o. male here with AMS. Likely from heroin use.   11:37 PM UDS + opiates, benzos, marijuana. Started on clonidine and ativan prn withdrawal. Will consult TTS. Medically cleared.    Richardean Canalavid H Rasheida Broden, MD 07/04/14 [phone]

## 2014-07-04 NOTE — ED Notes (Signed)
Bed: WA07 Expected date:  Expected time:  Means of arrival:  Comments: EMS dec LOC, ? Overdose; just released from Detox

## 2014-07-04 NOTE — ED Notes (Signed)
Patient is lethargic and talking to him self with abnormal statements. [facility] from detox.  [facility] and family was trying to get him admitted in [place].  Patient stopped  At the gas station today and saw friends.  When he got home he was altered  And family called 911.

## 2014-07-05 DIAGNOSIS — F1920 Other psychoactive substance dependence, uncomplicated: Secondary | ICD-10-CM

## 2014-07-05 MED ORDER — CLONIDINE HCL 0.1 MG PO TABS
0.1000 mg | ORAL_TABLET | Freq: Three times a day (TID) | ORAL | Status: DC
  Administered 2014-07-05 (×2): 0.1 mg via ORAL
  Filled 2014-07-05 (×2): qty 1

## 2014-07-05 MED ORDER — LEVETIRACETAM 750 MG PO TABS
750.0000 mg | ORAL_TABLET | Freq: Two times a day (BID) | ORAL | Status: DC
  Administered 2014-07-05: 750 mg via ORAL
  Filled 2014-07-05 (×2): qty 1

## 2014-07-05 MED ORDER — CLOTRIMAZOLE-BETAMETHASONE 1-0.05 % EX CREA: 1.0000 "application " | TOPICAL_CREAM | Freq: Two times a day (BID) | CUTANEOUS | Status: AC

## 2014-07-05 MED ORDER — TOPIRAMATE 25 MG PO TABS
100.0000 mg | ORAL_TABLET | Freq: Two times a day (BID) | ORAL | Status: DC
  Administered 2014-07-05: 100 mg via ORAL
  Filled 2014-07-05: qty 4

## 2014-07-05 MED ORDER — CLOTRIMAZOLE 1 % EX CREA
TOPICAL_CREAM | Freq: Two times a day (BID) | CUTANEOUS | Status: DC
  Filled 2014-07-05: qty 15

## 2014-07-05 MED ORDER — TRAZODONE HCL 100 MG PO TABS: 100.0000 mg | ORAL_TABLET | Freq: Every day | ORAL | Status: DC

## 2014-07-05 MED ORDER — LEVETIRACETAM 750 MG PO TABS
1500.0000 mg | ORAL_TABLET | Freq: Three times a day (TID) | ORAL | Status: DC
  Filled 2014-07-05 (×3): qty 2

## 2014-07-05 NOTE — ED Notes (Signed)
Patient states has "a rash" around genital area and feels it's because "of medication".  Will report to NP/MD.

## 2014-07-05 NOTE — ED Provider Notes (Signed)
Asked to see the patient prior to discharge. Patient ongoing treatment for opioid addiction. He is about to be discharged from behavioral health. He reports that he has a rash in his groin. Examination reveals redness, excoriation, these collimation in the groin area consistent with tinea cruris. Patient will be treated with Lotrisone topically.  Gilda Creasehristopher J. Amina Menchaca, MD 07/05/14 [phone]

## 2014-07-05 NOTE — ED Notes (Signed)
POC discussed with wife.  [place] suggested for support.

## 2014-07-05 NOTE — ED Notes (Signed)
AVS and prescriptions provided and reviewed. Verbalized understanding of DC instructions and prescriptions. Denied SI/HI/AVH. Denied pain. Offered no questions or concerns. Escorted off the unit and to front exit by MHT.

## 2014-07-05 NOTE — [facility] Suicide Risk Assessment (Signed)
Suicide Risk Assessment  Discharge Assessment     Demographic Factors:  Male and Unemployed  Total Time spent with patient: 30 minutes  Psychiatric Specialty Exam:     Blood pressure 154/89, pulse 73, temperature 98 F (36.7 C), temperature source Oral, resp. rate 18, SpO2 98.00%.There is no weight on file to calculate BMI.  General Appearance: Casual  Eye Contact::  Good  Speech:  Clear and Coherent  Volume:  Normal  Mood:  Anxious  Affect:  Appropriate  Thought Process:  Coherent and Logical  Orientation:  Full (Time, Place, and Person)  Thought Content:  Negative  Suicidal Thoughts:  No  Homicidal Thoughts:  No  Memory:  Immediate;   Good Recent;   Good Remote;   Good  Judgement:  Intact  Insight:  Fair  Psychomotor Activity:  Normal  Concentration:  Good  Recall:  Good  Fund of Knowledge:Good  Language: Good  Akathisia:  Negative  Handed:  Right  AIMS (if indicated):     Assets:  Communication Skills Housing Social Support  Sleep:       Musculoskeletal: Strength & Muscle Tone: within normal limits Gait & Station: normal Patient leans: N/A   Mental Status Per Nursing Assessment::   On Admission:     Current Mental Status by Physician: NA  Loss Factors: NA  Historical Factors: NA  Risk Reduction Factors:   NA  Continued Clinical Symptoms:  Alcohol/Substance Abuse/Dependencies  Cognitive Features That Contribute To Risk:  Closed-mindedness    Suicide Risk:  Minimal: No identifiable suicidal ideation.  Patients presenting with no risk factors but with morbid ruminations; may be classified as minimal risk based on the severity of the depressive symptoms  Discharge Diagnoses:   AXIS I:  polysubstance dependence AXIS II:  Deferred AXIS III:   Past Medical History  Diagnosis Date  . Hypertension   . Seizures   . Pneumothorax    AXIS IV:  chronic addiction AXIS V:  61-70 mild symptoms  Plan Of Care/Follow-up recommendations:   Activity:  resume usual activity Diet:  resume usual diet  Is patient on multiple antipsychotic therapies at discharge:  No   Has Patient had three or more failed trials of antipsychotic monotherapy by history:  No  Recommended Plan for Multiple Antipsychotic Therapies: NA    TAYLOR,GERALD D 07/05/2014, 11:22 AM

## 2014-07-05 NOTE — BH Assessment (Signed)
Tele Assessment Note   Dale SeatsCharles E Perry is an 58 y.o. male who [facility]. Pt reported that he thinks he had a seizure tonight. Pt's family reported pt went to gas station tonight to meet a friend and returned in altered mental state. Family concerned pt took something. Pt stated he [facility] ago after 27- day stay due to seizures. Pt also requesting detox from heroin. Pt reported no use of heroin since prior to [facility], although pt UDS is positive for opiates, benzos and marijuana. Pt stated he is unaware why he is positive for those at this time. Pt oriented only to self. Pt initially reported he [facility], he was unaware of the date and unaware of why he was at hospital. Pt stated he was at hospital because he needs help.   Pt denies SI, HI and AVH. Pt reported depressed mood with congruent affect. Pt denies prior inpatient psychiatric hospitalization. Pt reported no outpatient resources.    Axis I: Opioid Use Disorder Axis II: Deferred Axis III:  Past Medical History  Diagnosis Date  . Hypertension   . Seizures   . Pneumothorax    Axis IV: other psychosocial or environmental problems  Past Medical History:  Past Medical History  Diagnosis Date  . Hypertension   . Seizures   . Pneumothorax     Past Surgical History  Procedure Laterality Date  . Inner ear surgery    . Chest tube insertion      Family History: History reviewed. No pertinent family history.  Social History:  reports that he has quit smoking. He does not have any smokeless tobacco history on file. He reports that he drinks alcohol. He reports that he uses illicit drugs (Marijuana).  Additional Social History:  Alcohol / Drug Use Pain Medications: see list Prescriptions: see list Over the Counter: see list History of alcohol / drug use?: Yes Substance #1 Name of Substance 1: heroin 1 - Age of First Use: unk  1 - Amount  (size/oz): unk 1 - Frequency: unk  1 - Duration: ongoing 1 - Last Use / Amount: pt reported about a month ago  CIWA: CIWA-Ar BP: 139/72 mmHg Pulse Rate: 80 COWS: Clinical Opiate Withdrawal Scale (COWS) Resting Pulse Rate: Pulse Rate 80 or below Sweating: No report of chills or flushing Restlessness: Frequent shifting or extraneous movements of legs/arms Pupil Size: Pupils pinned or normal size for room light Bone or Joint Aches: Not present Runny Nose or Tearing: Not present GI Upset: No GI symptoms Tremor: No tremor Yawning: No yawning Anxiety or Irritability: Patient obviously irritable/anxious Gooseflesh Skin: Skin is smooth COWS Total Score: 5  Allergies: No Known Allergies  Home Medications:  (Not in a hospital admission)  OB/GYN Status:  No LMP for male patient.  General Assessment Data Location of Assessment: [facility] ED Is this a Tele or Face-to-Face Assessment?: Tele Assessment Is this an Initial Assessment or a Re-assessment for this encounter?: Initial Assessment Living Arrangements: Spouse/significant other Can pt return to current living arrangement?: Yes Admission Status: Voluntary Is patient capable of signing voluntary admission?: Yes Transfer from: Acute Hospital Referral Source: Self/Family/Friend  Medical Screening Exam [facility] Walk-in ONLY) Medical Exam completed:  (NA)  [facility] Living Arrangements: Spouse/significant other Name of Psychiatrist:  (none reported) Name of Therapist:  (none reported)     Risk to self Suicidal Ideation: No Suicidal Intent: No Is patient at risk  for suicide?: No Suicidal Plan?: No Access to Means: No What has been your use of drugs/alcohol within the last 12 months?: heroin Previous Attempts/Gestures: No How many times?: 0 Other Self Harm Risks: none reported Triggers for Past Attempts: None known Intentional Self Injurious Behavior: None Family Suicide History: Unknown Recent stressful life event(s):  Other (Comment) (unk) Persecutory voices/beliefs?: No Depression: Yes Depression Symptoms: Despondent Substance abuse history and/or treatment for substance abuse?: Yes Suicide prevention information given to non-admitted patients: Not applicable  Risk to Others Homicidal Ideation: No Thoughts of Harm to Others: No Current Homicidal Intent: No Current Homicidal Plan: No Access to Homicidal Means: No Identified Victim:  (NA) History of harm to others?: No Assessment of Violence: None Noted Violent Behavior Description: Pt calm and cooperative at assessment  Does patient have access to weapons?: Yes (Comment) (Pt reported that he owns a gun. ) Criminal Charges Pending?: No Does patient have a court date: No  Psychosis Hallucinations: None noted Delusions: None noted  Mental Status Report Appear/Hygiene: In scrubs Eye Contact: Fair Motor Activity: Unremarkable Speech: Logical/coherent Level of Consciousness: Quiet/awake Mood: Depressed Affect: Depressed Anxiety Level: None Thought Processes: Coherent;Relevant Judgement: Partial Orientation: Person Obsessive Compulsive Thoughts/Behaviors: None  Cognitive Functioning Concentration: Normal Memory: Recent Impaired;Remote Impaired IQ: Average Insight: Poor Impulse Control: Fair Appetite: Fair Weight Loss:  (unk) Weight Gain:  (unk) Sleep: No Change Total Hours of Sleep:  (unk) Vegetative Symptoms: None  ADLScreening [facility] Assessment Services) Patient's cognitive ability adequate to safely complete daily activities?: Yes Patient able to express need for assistance with ADLs?: Yes Independently performs ADLs?: Yes (appropriate for developmental age)  Prior Inpatient Therapy Prior Inpatient Therapy: No Prior Therapy Dates:  (NA) Prior Therapy Facilty/Provider(s):  (NA) Reason for Treatment:  (NA)  Prior Outpatient Therapy Prior Outpatient Therapy: No Prior Therapy Dates:  (NA) Prior Therapy Facilty/Provider(s):   (NA) Reason for Treatment:  (NA)  ADL Screening (condition at time of admission) Patient's cognitive ability adequate to safely complete daily activities?: Yes Is the patient deaf or have difficulty hearing?: No Does the patient have difficulty seeing, even when wearing glasses/contacts?: No Does the patient have difficulty concentrating, remembering, or making decisions?: No Patient able to express need for assistance with ADLs?: Yes Does the patient have difficulty dressing or bathing?: No Independently performs ADLs?: Yes (appropriate for developmental age)       Abuse/Neglect Assessment (Assessment to be complete while patient is alone) Physical Abuse: Denies Verbal Abuse: Denies Sexual Abuse: Denies Exploitation of patient/patient's resources: Denies Self-Neglect: Denies Values / Beliefs Cultural Requests During Hospitalization: None Spiritual Requests During Hospitalization: None   Advance Directives (For Healthcare) Advance Directive: Patient does not have advance directive    Additional Information 1:1 In Past 12 Months?: No CIRT Risk: No Elopement Risk: No Does patient have medical clearance?: Yes    Disposition Initial Assessment Completed for this Encounter: Yes Disposition of Patient: Other dispositions Other disposition(s): Other (Comment) (More collateral information needed before disposition)  Stewart,Rielly Corlett R 07/05/2014 5:19 AM

## 2014-07-05 NOTE — BH Assessment (Signed)
Per Lorayne Marekochelle, RN pt arrived to psych ED agitated. Pt recently fell sleep. Pt will be assessed later in the AM. Dr. Fayrene FearingJames was updated by clinician on status.   [name], MSW, LCSW Triage Specialist [phone]

## 2014-07-05 NOTE — BH Assessment (Signed)
Clinician contacted Dr Fayrene FearingJames to gather report prior to assessing pt.  Pt requesting detox from heroin. Assessment to be initiated.   [name] [name] [name], MSW, LCSW Triage Specialist [phone]

## 2014-07-05 NOTE — ED Notes (Signed)
NA material given

## 2014-07-05 NOTE — Consult Note (Signed)
[facility] Face-to-Face Psychiatry Consult   Reason for Consult:  Requesting detox from heroin Referring Physician:  ER MD  [name] is an 58 y.o. male. Total Time spent with patient: 30 minutes  Assessment: AXIS I:  polysubstance dependence AXIS II:  Deferred AXIS III:   Past Medical History  Diagnosis Date  . Hypertension   . Seizures   . Pneumothorax    AXIS IV:  seizure disorder with chronic substance dependence AXIS V:  61-70 mild symptoms  Plan:  No evidence of imminent risk to self or others at present.    Subjective:   [name] is a 58 y.o. male patient admitted with requesting detox from heroin.  HPI:  [name] says he is not seeking any psychiatric help.  His wife wants him in a long term program for drug abuse even though he is already supposed to go to a CD-IOP.  He denies any suicidal ideation, any homicidal ideation and is not psychotic.  He was just discharged from a hospital after a 20+ day stay for a seizure disorder and has not had long enough to use drugs to need detox again. HPI Elements:   Location:  polysubstance abuse. Quality:  says he has not used since he was discharged even though there were drugs in his system. Severity:  denying use . Timing:  wife wants him in long time care he says. Duration:  years. Context:  as above.  Past Psychiatric History: Past Medical History  Diagnosis Date  . Hypertension   . Seizures   . Pneumothorax     reports that he has quit smoking. He does not have any smokeless tobacco history on file. He reports that he drinks alcohol. He reports that he uses illicit drugs (Marijuana). History reviewed. No pertinent family history. Family History Substance Abuse: Yes, Describe: (heroin) Family Supports: Yes, List: (wife) Living Arrangements: Spouse/significant other Can pt return to current living arrangement?: Yes Abuse/Neglect [facility]) Physical Abuse: Denies Verbal Abuse: Denies Sexual Abuse: Denies Allergies:  No  Known Allergies  ACT Assessment Complete:  No:   Past Psychiatric History: Diagnosis:  Polysubstance dependence  Hospitalizations:  Just out of inpatient several days ago  Outpatient Care:  none  Substance Abuse Care:  Scheduled for IOP but has not started it  Self-Mutilation:  none  Suicidal Attempts:  none  Homicidal Behaviors:  none   Violent Behaviors:  none   Place of Residence:  Lives with wife in [place] Marital Status:  married Employed/Unemployed:  unemployed Education:  Not known Family Supports:  yes Objective: Blood pressure 154/89, pulse 73, temperature 98 F (36.7 C), temperature source Oral, resp. rate 18, SpO2 98.00%.There is no weight on file to calculate BMI. Results for orders placed during the hospital encounter of 07/04/14 (from the past 72 hour(s))  CBC WITH DIFFERENTIAL     Status: None   Collection Time    07/04/14  8:00 PM      Result Value Ref Range   WBC 5.1  4.0 - 10.5 K/uL   RBC 4.72  4.22 - 5.81 MIL/uL   Hemoglobin 14.4  13.0 - 17.0 g/dL   HCT 42.0  39.0 - 52.0 %   MCV 89.0  78.0 - 100.0 fL   MCH 30.5  26.0 - 34.0 pg   MCHC 34.3  30.0 - 36.0 g/dL   RDW 12.8  11.5 - 15.5 %   Platelets 261  150 - 400 K/uL   Neutrophils Relative %  51  43 - 77 %   Neutro Abs 2.6  1.7 - 7.7 K/uL   Lymphocytes Relative 39  12 - 46 %   Lymphs Abs 2.0  0.7 - 4.0 K/uL   Monocytes Relative 8  3 - 12 %   Monocytes Absolute 0.4  0.1 - 1.0 K/uL   Eosinophils Relative 2  0 - 5 %   Eosinophils Absolute 0.1  0.0 - 0.7 K/uL   Basophils Relative 0  0 - 1 %   Basophils Absolute 0.0  0.0 - 0.1 K/uL  COMPREHENSIVE METABOLIC PANEL     Status: Abnormal   Collection Time    07/04/14  8:00 PM      Result Value Ref Range   Sodium 141  137 - 147 mEq/L   Potassium 4.2  3.7 - 5.3 mEq/L   Chloride 100  96 - 112 mEq/L   CO2 30  19 - 32 mEq/L   Glucose, Bld 126 (*) 70 - 99 mg/dL   BUN 20  6 - 23 mg/dL   Creatinine, Ser 1.01  0.50 - 1.35 mg/dL   Calcium 9.6  8.4 - 10.5 mg/dL    Total Protein 7.5  6.0 - 8.3 g/dL   Albumin 4.0  3.5 - 5.2 g/dL   AST 15  0 - 37 U/L   ALT 30  0 - 53 U/L   Alkaline Phosphatase 78  39 - 117 U/L   Total Bilirubin <0.2 (*) 0.3 - 1.2 mg/dL   GFR calc non Af Amer 79 (*) >90 mL/min   GFR calc Af Amer >90  >90 mL/min   Comment: (NOTE)     The eGFR has been calculated using the CKD EPI equation.     This calculation has not been validated in all clinical situations.     eGFR's persistently <90 mL/min signify possible Chronic Kidney     Disease.   Anion gap 11  5 - 15  ETHANOL     Status: None   Collection Time    07/04/14  8:00 PM      Result Value Ref Range   Alcohol, Ethyl (B) <11  0 - 11 mg/dL   Comment:            LOWEST DETECTABLE LIMIT FOR     SERUM ALCOHOL IS 11 mg/dL     FOR MEDICAL PURPOSES ONLY  SALICYLATE LEVEL     Status: Abnormal   Collection Time    07/04/14  8:00 PM      Result Value Ref Range   Salicylate Lvl <8.5 (*) 2.8 - 20.0 mg/dL  ACETAMINOPHEN LEVEL     Status: None   Collection Time    07/04/14  8:00 PM      Result Value Ref Range   Acetaminophen (Tylenol), Serum <15.0  10 - 30 ug/mL   Comment:            THERAPEUTIC CONCENTRATIONS VARY     SIGNIFICANTLY. A RANGE OF 10-30     ug/mL MAY BE AN EFFECTIVE     CONCENTRATION FOR MANY PATIENTS.     HOWEVER, SOME ARE BEST TREATED     AT CONCENTRATIONS OUTSIDE THIS     RANGE.     ACETAMINOPHEN CONCENTRATIONS     >150 ug/mL AT 4 HOURS AFTER     INGESTION AND >50 ug/mL AT 12     HOURS AFTER INGESTION ARE     OFTEN ASSOCIATED WITH TOXIC  REACTIONS.  URINE RAPID DRUG SCREEN (HOSP PERFORMED)     Status: Abnormal   Collection Time    07/04/14  9:20 PM      Result Value Ref Range   Opiates POSITIVE (*) NONE DETECTED   Cocaine NONE DETECTED  NONE DETECTED   Benzodiazepines POSITIVE (*) NONE DETECTED   Amphetamines NONE DETECTED  NONE DETECTED   Tetrahydrocannabinol POSITIVE (*) NONE DETECTED   Barbiturates NONE DETECTED  NONE DETECTED   Comment:             DRUG SCREEN FOR MEDICAL PURPOSES     ONLY.  IF CONFIRMATION IS NEEDED     FOR ANY PURPOSE, NOTIFY LAB     WITHIN 5 DAYS.                LOWEST DETECTABLE LIMITS     FOR URINE DRUG SCREEN     Drug Class       Cutoff (ng/mL)     Amphetamine      1000     Barbiturate      200     Benzodiazepine   151     Tricyclics       761     Opiates          300     Cocaine          300     THC              50  URINALYSIS, ROUTINE W REFLEX MICROSCOPIC     Status: None   Collection Time    07/04/14  9:20 PM      Result Value Ref Range   Color, Urine YELLOW  YELLOW   APPearance CLEAR  CLEAR   Specific Gravity, Urine 1.023  1.005 - 1.030   pH 5.0  5.0 - 8.0   Glucose, UA NEGATIVE  NEGATIVE mg/dL   Hgb urine dipstick NEGATIVE  NEGATIVE   Bilirubin Urine NEGATIVE  NEGATIVE   Ketones, ur NEGATIVE  NEGATIVE mg/dL   Protein, ur NEGATIVE  NEGATIVE mg/dL   Urobilinogen, UA 0.2  0.0 - 1.0 mg/dL   Nitrite NEGATIVE  NEGATIVE   Leukocytes, UA NEGATIVE  NEGATIVE   Comment: MICROSCOPIC NOT DONE ON URINES WITH NEGATIVE PROTEIN, BLOOD, LEUKOCYTES, NITRITE, OR GLUCOSE <1000 mg/dL.   Labs are reviewed and are pertinent for opiates, benzodiazepines,THC.  Current Facility-Administered Medications  Medication Dose Route Frequency Provider Last Rate Last Dose  . acetaminophen (TYLENOL) tablet 650 mg  650 mg Oral Q4H PRN Wandra Arthurs, MD      . cloNIDine (CATAPRES) tablet 0.1 mg  0.1 mg Oral TID Jobe Igo, RN   0.1 mg at 07/05/14 1013  . dicyclomine (BENTYL) tablet 20 mg  20 mg Oral Q6H PRN Wandra Arthurs, MD      . hydrOXYzine (ATARAX/VISTARIL) tablet 25 mg  25 mg Oral Q6H PRN Wandra Arthurs, MD      . ibuprofen (ADVIL,MOTRIN) tablet 600 mg  600 mg Oral Q8H PRN Wandra Arthurs, MD   600 mg at 07/05/14 0756  . levETIRAcetam (KEPPRA) tablet 750 mg  750 mg Oral BID Jobe Igo, RN      . loperamide (IMODIUM) capsule 2-4 mg  2-4 mg Oral PRN Wandra Arthurs, MD      . LORazepam (ATIVAN) tablet 1 mg  1 mg Oral Q8H  PRN Wandra Arthurs, MD      . methocarbamol (ROBAXIN)  tablet 500 mg  500 mg Oral Q8H PRN Wandra Arthurs, MD      . naproxen (NAPROSYN) tablet 500 mg  500 mg Oral BID PRN Wandra Arthurs, MD      . ondansetron (ZOFRAN-ODT) disintegrating tablet 4 mg  4 mg Oral Q6H PRN Wandra Arthurs, MD      . topiramate (TOPAMAX) tablet 100 mg  100 mg Oral BID Jobe Igo, RN   100 mg at 07/05/14 1010  . traZODone (DESYREL) tablet 100 mg  100 mg Oral QHS Jobe Igo, RN       Current Outpatient Prescriptions  Medication Sig Dispense Refill  . cloNIDine (CATAPRES) 0.1 MG tablet Take 0.1 mg by mouth 3 (three) times daily.      . hydrOXYzine (ATARAX/VISTARIL) 50 MG tablet Take 50 mg by mouth 4 (four) times daily.      Marland Kitchen levETIRAcetam (KEPPRA) 750 MG tablet Take 750 mg by mouth 2 (two) times daily.       . methadone (DOLOPHINE) 10 MG tablet Take 20 mg by mouth daily.      Marland Kitchen topiramate (TOPAMAX) 100 MG tablet Take 100 mg by mouth every 12 (twelve) hours.      . traZODone (DESYREL) 100 MG tablet Take 100 mg by mouth at bedtime.        Psychiatric Specialty Exam:     Blood pressure 154/89, pulse 73, temperature 98 F (36.7 C), temperature source Oral, resp. rate 18, SpO2 98.00%.There is no weight on file to calculate BMI.  General Appearance: Casual  Eye Contact::  Good  Speech:  Clear and Coherent  Volume:  Normal  Mood:  Depressed  Affect:  Appropriate  Thought Process:  Coherent and Logical  Orientation:  Full (Time, Place, and Person)  Thought Content:  Negative  Suicidal Thoughts:  No  Homicidal Thoughts:  No  Memory:  Immediate;   Good Recent;   Good Remote;   Good  Judgement:  Good  Insight:  Fair  Psychomotor Activity:  Normal  Concentration:  Good  Recall:  Good  Fund of Knowledge:Good  Language: Good  Akathisia:  Negative  Handed:  Right  AIMS (if indicated):     Assets:  Chief Executive Officer Social Support Transportation  Sleep:      Musculoskeletal: Strength & Muscle  Tone: within normal limits Gait & Station: normal Patient leans: N/A  Treatment Plan Summary: discharge home to be followed outpatient with a CD IOP referral already made for [facility] at 10 am July 9.  Emerly Prak D 07/05/2014 10:55 AM

## 2014-07-05 NOTE — Consult Note (Signed)
  Review of Systems  Constitutional: Negative.   HENT: Negative.   Eyes: Negative.   Respiratory: Negative.   Cardiovascular: Negative.   Gastrointestinal: Negative.   Genitourinary: Negative.   Musculoskeletal: Negative.   Skin: Positive for rash.  Neurological: Positive for seizures.  Endo/Heme/Allergies: Negative.   Psychiatric/Behavioral: Positive for substance abuse.

## 2014-07-05 NOTE — BH Assessment (Signed)
Per pt:  Patient's wife: Betsey HolidayCarolyn Passey [phone] Patient's mother: Oretha MilchBernice Seegars [phone]  Yaakov Guthrieelilah Stewart, MSW, LCSW Triage Specialist [phone]

## 2014-07-05 NOTE — Progress Notes (Signed)
[place] [facility] stating that patient's insurance is not in network. CSW informed patient who states that he cannot afford out of pocket IOP program expense. CSW cancelled appointment that was scheduled for 07/05/14. CSW not able to find IOP program in insurance network at this time. CSW provided patient with outpatient resources and encouraged him to follow up at discharge. Patient agreeable, has no questions at this time.   Samuella BruinKristin Demarr Kluever, MSW, [facility] Clinical Social Worker [facility] [phone]

## 2014-07-05 NOTE — BH Assessment (Signed)
Clinician consulted with Donell SievertSpencer Simon, PA who recommends further collateral information is needed prior to disposition. Follow up needed later in the AM with mother and wife to inquire about pt's presenting issues and psychiatric/ substance abuse history.   Yaakov Guthrieelilah Stewart, MSW, LCSW Triage Specialist [phone]

## 2014-07-05 NOTE — Progress Notes (Signed)
CSW [facility] for tomorrow 07/06/14 at 11 am with Onalee Huaavid. Patient agreeable to appointment, CSW also provided substance abuse treatment resources for patient.   Samuella BruinKristin Abhay Godbolt, MSW, [facility] Clinical Social Worker [facility] [phone]

## 2014-07-05 NOTE — ED Notes (Signed)
Patient called family to check on d/c pick-up.  Patient states they "should be here by 7p".  Bus pass offered and declined by pt.  States he lives in [place]. Will report to oncoming shift.

## 2014-07-06 ENCOUNTER — Emergency Department: Payer: Self-pay | Admitting: Emergency Medicine

## 2014-07-06 LAB — COMPREHENSIVE METABOLIC PANEL
ALBUMIN: 4.1 g/dL (ref 3.4–5.0)
ANION GAP: 7 (ref 7–16)
Alkaline Phosphatase: 80 U/L
BILIRUBIN TOTAL: 0.3 mg/dL (ref 0.2–1.0)
BUN: 12 mg/dL (ref 7–18)
CHLORIDE: 103 mmol/L (ref 98–107)
Calcium, Total: 9.6 mg/dL (ref 8.5–10.1)
Co2: 29 mmol/L (ref 21–32)
Creatinine: 1 mg/dL (ref 0.60–1.30)
EGFR (African American): >60
Glucose: 81 mg/dL (ref 65–99)
Osmolality: 276 (ref 275–301)
Potassium: 3.8 mmol/L (ref 3.5–5.1)
SGOT(AST): 19 U/L (ref 15–37)
SGPT (ALT): 40 U/L (ref 12–78)
Sodium: 139 mmol/L (ref 136–145)
TOTAL PROTEIN: 8 g/dL (ref 6.4–8.2)

## 2014-07-06 LAB — CBC
HCT: 43.1 % (ref 40.0–52.0)
HGB: 14.5 g/dL (ref 13.0–18.0)
MCH: 31.5 pg (ref 26.0–34.0)
MCHC: 33.5 g/dL (ref 32.0–36.0)
MCV: 94 fL (ref 80–100)
PLATELETS: 251 10*3/uL (ref 150–440)
RBC: 4.6 10*6/uL (ref 4.40–5.90)
RDW: 13.9 % (ref 11.5–14.5)
WBC: 4.8 10*3/uL (ref 3.8–10.6)

## 2014-07-06 LAB — URINALYSIS, COMPLETE
BACTERIA: NONE SEEN
BILIRUBIN, UR: NEGATIVE
BLOOD: NEGATIVE
Glucose,UR: NEGATIVE mg/dL (ref 0–75)
Ketone: NEGATIVE
Leukocyte Esterase: NEGATIVE
NITRITE: NEGATIVE
PH: 7 (ref 4.5–8.0)
Protein: NEGATIVE
RBC,UR: <1 /HPF (ref 0–5)
SQUAMOUS EPITHELIAL: NONE SEEN
Specific Gravity: 1.008 (ref 1.003–1.030)
WBC UR: 2 /HPF (ref 0–5)

## 2014-07-06 LAB — DRUG SCREEN, URINE
Amphetamines, Ur Screen: NEGATIVE (ref ?–1000)
Barbiturates, Ur Screen: NEGATIVE (ref ?–200)
Benzodiazepine, Ur Scrn: NEGATIVE (ref ?–200)
Cannabinoid 50 Ng, Ur ~~LOC~~: POSITIVE (ref ?–50)
Cocaine Metabolite,Ur ~~LOC~~: NEGATIVE (ref ?–300)
MDMA (ECSTASY) UR SCREEN: NEGATIVE (ref ?–500)
METHADONE, UR SCREEN: NEGATIVE (ref ?–300)
OPIATE, UR SCREEN: POSITIVE (ref ?–300)
Phencyclidine (PCP) Ur S: NEGATIVE (ref ?–25)
Tricyclic, Ur Screen: NEGATIVE (ref ?–1000)

## 2014-07-06 LAB — ACETAMINOPHEN LEVEL: Acetaminophen: <2 ug/mL

## 2014-07-06 LAB — ETHANOL: Ethanol %: <0.003 % (ref 0.000–0.080)

## 2014-07-06 LAB — SALICYLATE LEVEL
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
## 2015-12-30 DEATH — deceased
